# Patient Record
Sex: Male | Born: 1985
Health system: Southern US, Community
[De-identification: ages and names within clinical notes are randomized; demographics above are authoritative.]

## PROBLEM LIST (undated history)

## (undated) DIAGNOSIS — E669 Obesity, unspecified: Secondary | ICD-10-CM

## (undated) HISTORY — PX: TONSILLECTOMY: SUR1361

---

## 2013-09-01 ENCOUNTER — Encounter (HOSPITAL_BASED_OUTPATIENT_CLINIC_OR_DEPARTMENT_OTHER): Payer: Self-pay | Admitting: *Deleted

## 2013-09-01 ENCOUNTER — Emergency Department (HOSPITAL_BASED_OUTPATIENT_CLINIC_OR_DEPARTMENT_OTHER)
Admission: EM | Admit: 2013-09-01 | Discharge: 2013-09-01 | Disposition: A | Discharge status: Home / Self Care | Payer: Self-pay | Attending: Emergency Medicine | Admitting: Emergency Medicine

## 2013-09-01 ENCOUNTER — Emergency Department (HOSPITAL_BASED_OUTPATIENT_CLINIC_OR_DEPARTMENT_OTHER): Payer: Self-pay

## 2013-09-01 DIAGNOSIS — S46909A Unspecified injury of unspecified muscle, fascia and tendon at shoulder and upper arm level, unspecified arm, initial encounter: Secondary | ICD-10-CM | POA: Insufficient documentation

## 2013-09-01 DIAGNOSIS — Y929 Unspecified place or not applicable: Secondary | ICD-10-CM | POA: Insufficient documentation

## 2013-09-01 DIAGNOSIS — E669 Obesity, unspecified: Secondary | ICD-10-CM | POA: Insufficient documentation

## 2013-09-01 DIAGNOSIS — S4980XA Other specified injuries of shoulder and upper arm, unspecified arm, initial encounter: Secondary | ICD-10-CM | POA: Insufficient documentation

## 2013-09-01 DIAGNOSIS — Y9389 Activity, other specified: Secondary | ICD-10-CM | POA: Insufficient documentation

## 2013-09-01 DIAGNOSIS — X503XXA Overexertion from repetitive movements, initial encounter: Secondary | ICD-10-CM | POA: Insufficient documentation

## 2013-09-01 DIAGNOSIS — M7989 Other specified soft tissue disorders: Secondary | ICD-10-CM | POA: Insufficient documentation

## 2013-09-01 DIAGNOSIS — Y990 Civilian activity done for income or pay: Secondary | ICD-10-CM | POA: Insufficient documentation

## 2013-09-01 HISTORY — DX: Obesity, unspecified: E66.9

## 2013-09-01 MED ORDER — HYDROCODONE-ACETAMINOPHEN 5-325 MG PO TABS
2.0000 | ORAL_TABLET | Freq: Once | ORAL | Status: AC
  Administered 2013-09-01: 2 via ORAL
  Filled 2013-09-01: qty 2

## 2013-09-01 MED ORDER — IBUPROFEN 800 MG PO TABS: 800.0000 mg | ORAL_TABLET | Freq: Three times a day (TID) | ORAL | Status: DC

## 2013-09-01 NOTE — ED Notes (Signed)
Patient here with c/o swelling to his right hand above the wrist.  Patient lifts a lot at his work.  Swelling noted to his right arm just above the wrist.  Able to bend wrist but painful.

## 2013-09-01 NOTE — ED Provider Notes (Signed)
CSN: [id number]     Arrival date & time 09/01/13  2115 History   First MD Initiated Contact with Patient 09/01/13 2125     Chief Complaint  Patient presents with  . Arm Injury   (Consider location/radiation/quality/duration/timing/severity/associated sxs/prior Treatment) HPI Comments: No injury noted. Noted R forearm swelling that started earlier today. Associated pain. No fever, nausea, vomiting. No diarrhea.   Patient is a 27 y.o. male presenting with extremity pain. The history is provided by the patient.  Extremity Pain This is a new problem. The current episode started 6 to 12 hours ago. The problem occurs constantly. The problem has not changed since onset.Pertinent negatives include no chest pain, no abdominal pain, no headaches and no shortness of breath. Nothing aggravates the symptoms. Nothing relieves the symptoms. He has tried nothing for the symptoms.    Past Medical History  Diagnosis Date  . Obese    History reviewed. No pertinent past surgical history. History reviewed. No pertinent family history. History  Substance Use Topics  . Smoking status: Never Smoker   . Smokeless tobacco: Not on file  . Alcohol Use: No    Review of Systems  Respiratory: Negative for shortness of breath.   Cardiovascular: Negative for chest pain.  Gastrointestinal: Negative for abdominal pain.  Neurological: Negative for headaches.  All other systems reviewed and are negative.    Allergies  Review of patient's allergies indicates no known allergies.  Home Medications  No current outpatient prescriptions on file. BP 156/88  Pulse 85  Temp(Src) 98.1 F (36.7 C) (Oral)  Resp 20  Ht 5\' 7"  (1.702 m)  Wt 422 lb 6.4 oz (191.599 kg)  BMI 66.14 kg/m2  SpO2 100% Physical Exam  Nursing note and vitals reviewed. Constitutional: He is oriented to person, place, and time. He appears well-developed and well-nourished. No distress.  HENT:  Head: Normocephalic and atraumatic.   Mouth/Throat: No oropharyngeal exudate.  Eyes: EOM are normal. Pupils are equal, round, and reactive to light.  Neck: Normal range of motion. Neck supple.  Cardiovascular: Normal rate and regular rhythm.  Exam reveals no friction rub.   No murmur heard. Pulmonary/Chest: Effort normal and breath sounds normal. No respiratory distress. He has no wheezes. He has no rales.  Abdominal: He exhibits no distension. There is no tenderness. There is no rebound.  Musculoskeletal: Normal range of motion. He exhibits no edema.       Right forearm: He exhibits tenderness and swelling (distal forearm, no erythema, no celluluitis). He exhibits no bony tenderness, no edema, no deformity and no laceration.  Neurological: He is alert and oriented to person, place, and time.  Skin: He is not diaphoretic.    ED Course  Procedures (including critical care time) Labs Review Labs Reviewed - No data to display Imaging Review Dg Forearm Right  09/01/2013   CLINICAL DATA:  Right forearm pain.  EXAM: RIGHT FOREARM - 2 VIEW  COMPARISON:  No priors.  FINDINGS: AP and lateral views of the right forearm demonstrate no definite acute displaced fracture. Overlying soft tissues are unremarkable.  IMPRESSION: No evidence of significant acute traumatic injury to the right radius or ulna.   Electronically Signed   By: Trudie Reed M.D.   On: 09/01/2013 22:23    MDM   1. Arm swelling    Presents with right forearm swelling. No injury noted. 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 He stated the right distal forearm insulin today. On exam,  vitals are stable. His normal right elbow function with normal range of motion. He has decreased range of motion his right wrist, but no wrist effusion, no redness of the skin, no cellulitis. He has formed swelling on the dorsal aspect of the forearm and the distal aspect spreading to about midshaft of the forearm. It is mildly tender. It is not warm, is not  fluctuant is not cellulitic. Xray ordered and negative. Patient instructed to rest, ice, compress. No need for antibiotics at this time.   Dagmar Hait, MD 09/01/13 [phone]

## 2013-09-12 DIAGNOSIS — J209 Acute bronchitis, unspecified: Secondary | ICD-10-CM | POA: Insufficient documentation

## 2013-09-12 DIAGNOSIS — R0789 Other chest pain: Secondary | ICD-10-CM | POA: Insufficient documentation

## 2013-09-12 DIAGNOSIS — Z791 Long term (current) use of non-steroidal anti-inflammatories (NSAID): Secondary | ICD-10-CM | POA: Insufficient documentation

## 2013-09-12 NOTE — ED Notes (Signed)
Pt c/o chest congestion, cough and chest tightness x 3 days.

## 2013-09-13 ENCOUNTER — Encounter (HOSPITAL_BASED_OUTPATIENT_CLINIC_OR_DEPARTMENT_OTHER): Payer: Self-pay | Admitting: Emergency Medicine

## 2013-09-13 ENCOUNTER — Emergency Department (HOSPITAL_BASED_OUTPATIENT_CLINIC_OR_DEPARTMENT_OTHER): Payer: Self-pay

## 2013-09-13 ENCOUNTER — Emergency Department (HOSPITAL_BASED_OUTPATIENT_CLINIC_OR_DEPARTMENT_OTHER)
Admission: EM | Admit: 2013-09-13 | Discharge: 2013-09-13 | Disposition: A | Discharge status: Home / Self Care | Payer: Self-pay | Attending: Emergency Medicine | Admitting: Emergency Medicine

## 2013-09-13 DIAGNOSIS — J40 Bronchitis, not specified as acute or chronic: Secondary | ICD-10-CM

## 2013-09-13 HISTORY — DX: Morbid (severe) obesity due to excess calories: E66.01

## 2013-09-13 MED ORDER — DOXYCYCLINE HYCLATE 100 MG PO CAPS: 100.0000 mg | ORAL_CAPSULE | Freq: Two times a day (BID) | ORAL | Status: DC

## 2013-09-13 MED ORDER — PREDNISONE 50 MG PO TABS: 50.0000 mg | ORAL_TABLET | Freq: Every day | ORAL | Status: DC

## 2013-09-13 NOTE — ED Provider Notes (Signed)
CSN: [id number]     Arrival date & time 09/12/13  2349 History   First MD Initiated Contact with Patient 09/13/13 [phone]     Chief Complaint  Patient presents with  . URI   (Consider location/radiation/quality/duration/timing/severity/associated sxs/prior Treatment) HPI Comments: 27 y/o comes in with cc of chest congestion and tightness x 3 days. Pt has hx of DM, HTN, no cardiac hx, no hx of DVT, PE. Pt reports that for the past few days, he has been having cough and URI like sx. Cough is mostly dry. He has some chest tightness, only with cough. He is noted to morbidly obese, but denies any dib, recent travels, cancer hx, exogenous estrogen use. Denies drug use, smoking. No hx of asthma.   Patient is a 27 y.o. male presenting with URI. The history is provided by the patient.  URI Presenting symptoms: congestion and cough     Past Medical History  Diagnosis Date  . Obese   . Morbid obesity    Past Surgical History  Procedure Laterality Date  . Tonsillectomy     No family history on file. History  Substance Use Topics  . Smoking status: Never Smoker   . Smokeless tobacco: Not on file  . Alcohol Use: No    Review of Systems  Constitutional: Negative for diaphoresis, activity change and appetite change.  HENT: Positive for congestion.   Respiratory: Positive for cough and chest tightness. Negative for shortness of breath.   Cardiovascular: Positive for chest pain.  Gastrointestinal: Negative for nausea, vomiting and abdominal pain.    Allergies  Review of patient's allergies indicates no known allergies.  Home Medications   Current Outpatient Rx  Name  Route  Sig  Dispense  Refill  . Pseudoeph-Doxylamine-DM-APAP (NIGHT TIME LIQUID CAPS PO)   Oral   Take by mouth.         . doxycycline (VIBRAMYCIN) 100 MG capsule   Oral   Take 1 capsule (100 mg total) by mouth 2 (two) times daily.   14 capsule   0   . ibuprofen (ADVIL,MOTRIN) 800 MG tablet   Oral   Take 1  tablet (800 mg total) by mouth 3 (three) times daily.   21 tablet   0   . predniSONE (DELTASONE) 50 MG tablet   Oral   Take 1 tablet (50 mg total) by mouth daily.   5 tablet   0    BP 183/107  Pulse 82  Temp(Src) 98.7 F (37.1 C) (Oral)  Resp 18  Ht 5\' 7"  (1.702 m)  Wt 417 lb 12.8 oz (189.513 kg)  BMI 65.42 kg/m2  SpO2 100% Physical Exam  Nursing note and vitals reviewed. Constitutional: He is oriented to person, place, and time. He appears well-developed.  Morbidly obese  HENT:  Head: Normocephalic and atraumatic.  Eyes: Conjunctivae and EOM are normal. Pupils are equal, round, and reactive to light.  Neck: Normal range of motion. Neck supple.  Cardiovascular: Normal rate and regular rhythm.   Pulmonary/Chest: Effort normal. No respiratory distress. He has wheezes. He has no rales.  Very faint wheez in the upper lung fields.  Abdominal: Soft. Bowel sounds are normal. He exhibits no distension. There is no tenderness. There is no rebound and no guarding.  Neurological: He is alert and oriented to person, place, and time.  Skin: Skin is warm.    ED Course  Procedures (including critical care time) Labs Review Labs Reviewed - No data to display Imaging Review Dg Chest  2 View  09/13/2013   CLINICAL DATA:  Bilateral upper chest pain. Cough and shortness of breath. Symptoms for 4 days.  EXAM: CHEST  2 VIEW  COMPARISON:  None.  FINDINGS: Heart size and mediastinal contours are within normal limits. Both lungs are clear. Visualized skeletal structures are unremarkable.  IMPRESSION: Negative chest.   Electronically Signed   By: Drusilla Kanner M.D.   On: 09/13/2013 01:31    MDM   1. Bronchitis    Pt comes in with cc of chest tightness, with cough and exam indicates very fine wheez. He is only 26, does have DM, HTN hx, but these sx are all present for 3 days, and the constellation of sx make it unlikely to be cardiac. He is morbidly obese, but has no other risk factors for  PE, and his legs shows no unilateral calf tenderness or leg swelling. Pt is also PERC negative.  We will treat him as bronchitis, CXR is normal, and i discussed the warning signs for PE and, he is to return to the ER if the symptoms get worse.   Derwood Kaplan, MD 09/13/13 [phone]

## 2014-08-24 IMAGING — CR DG CHEST 2V
2 series · 2 of 2 positions shown · non-contrast
Comparison: None.

CLINICAL DATA: Bilateral upper chest pain. Cough and shortness of
breath. Symptoms for 4 days.

EXAM:
CHEST  2 VIEW

[w chest pa]
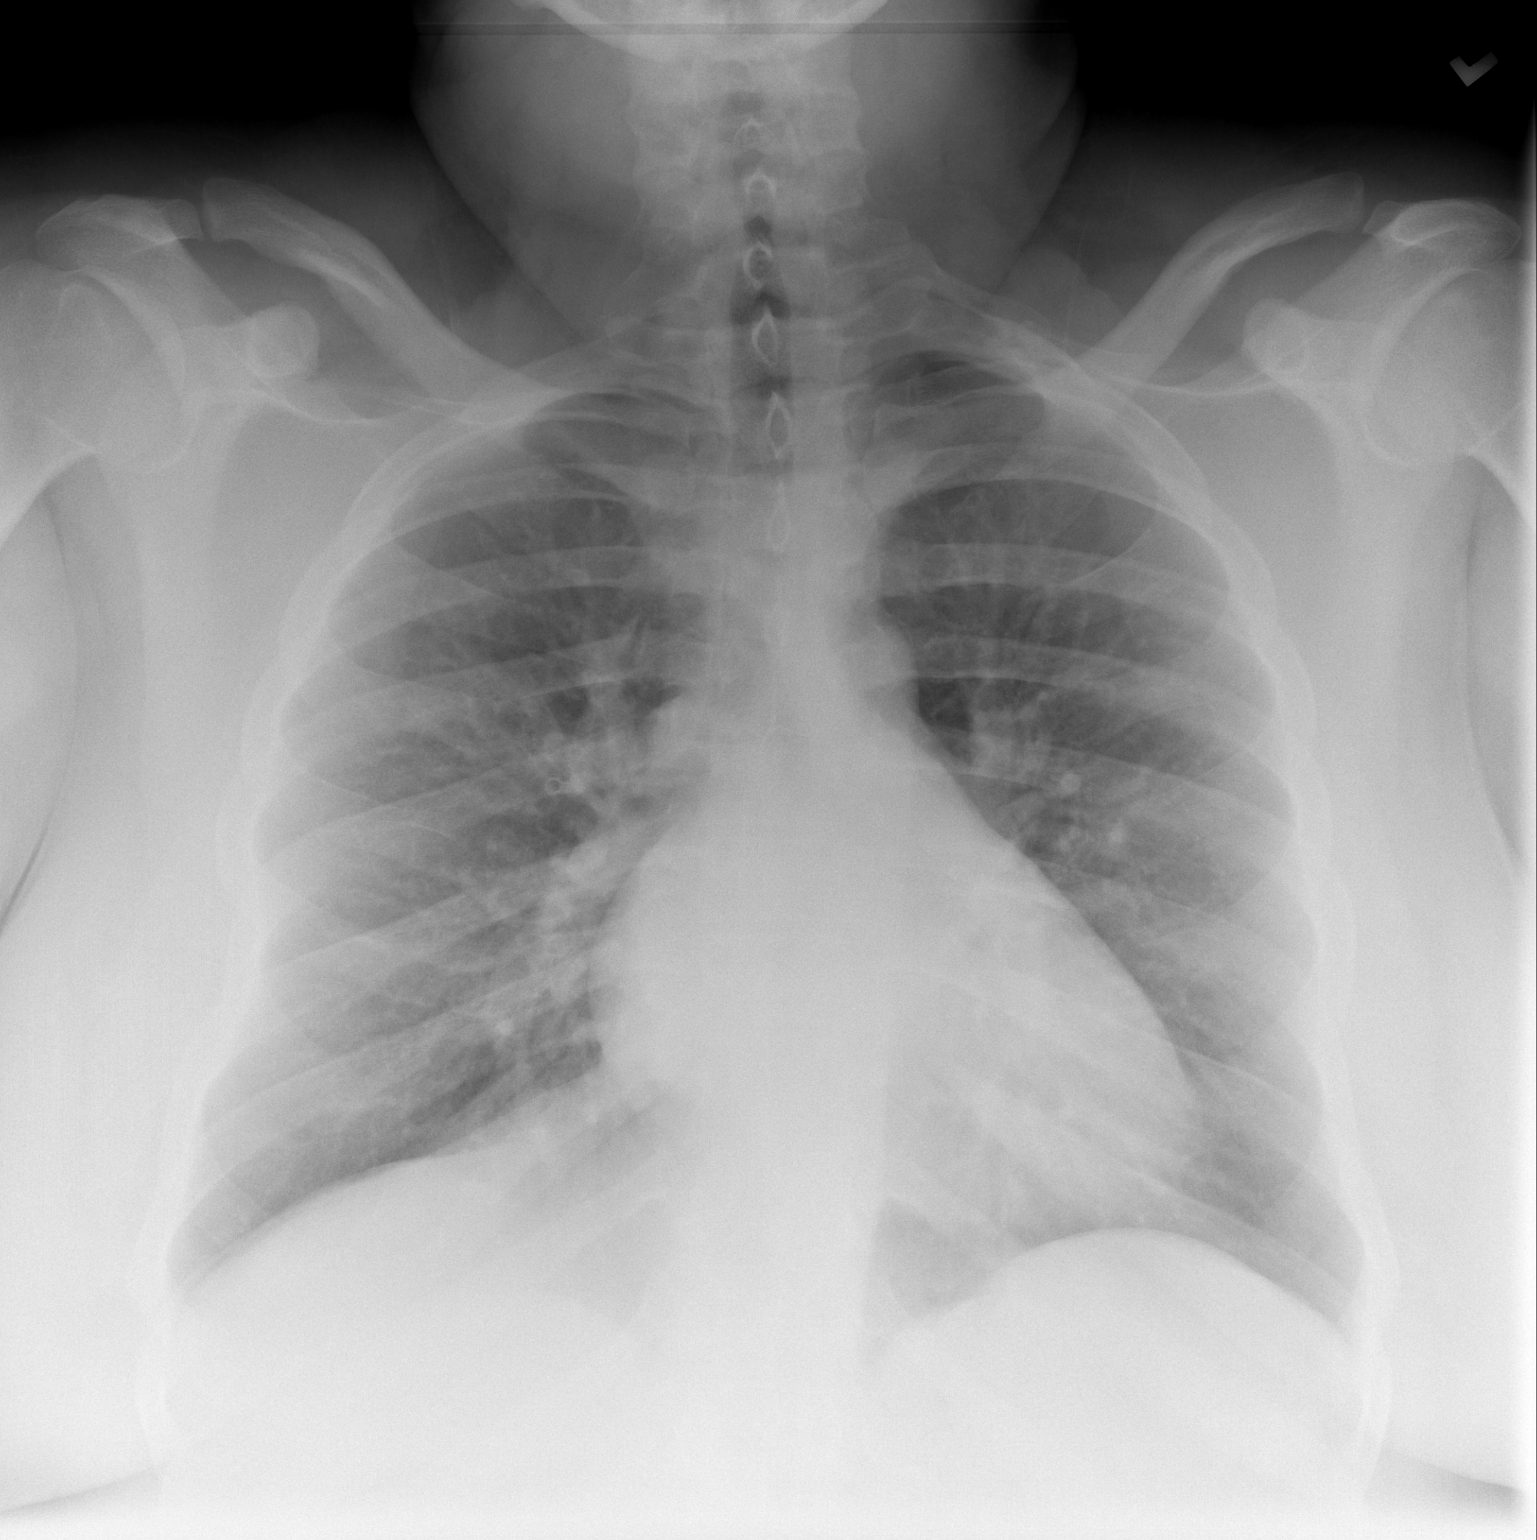

[w chest lat]
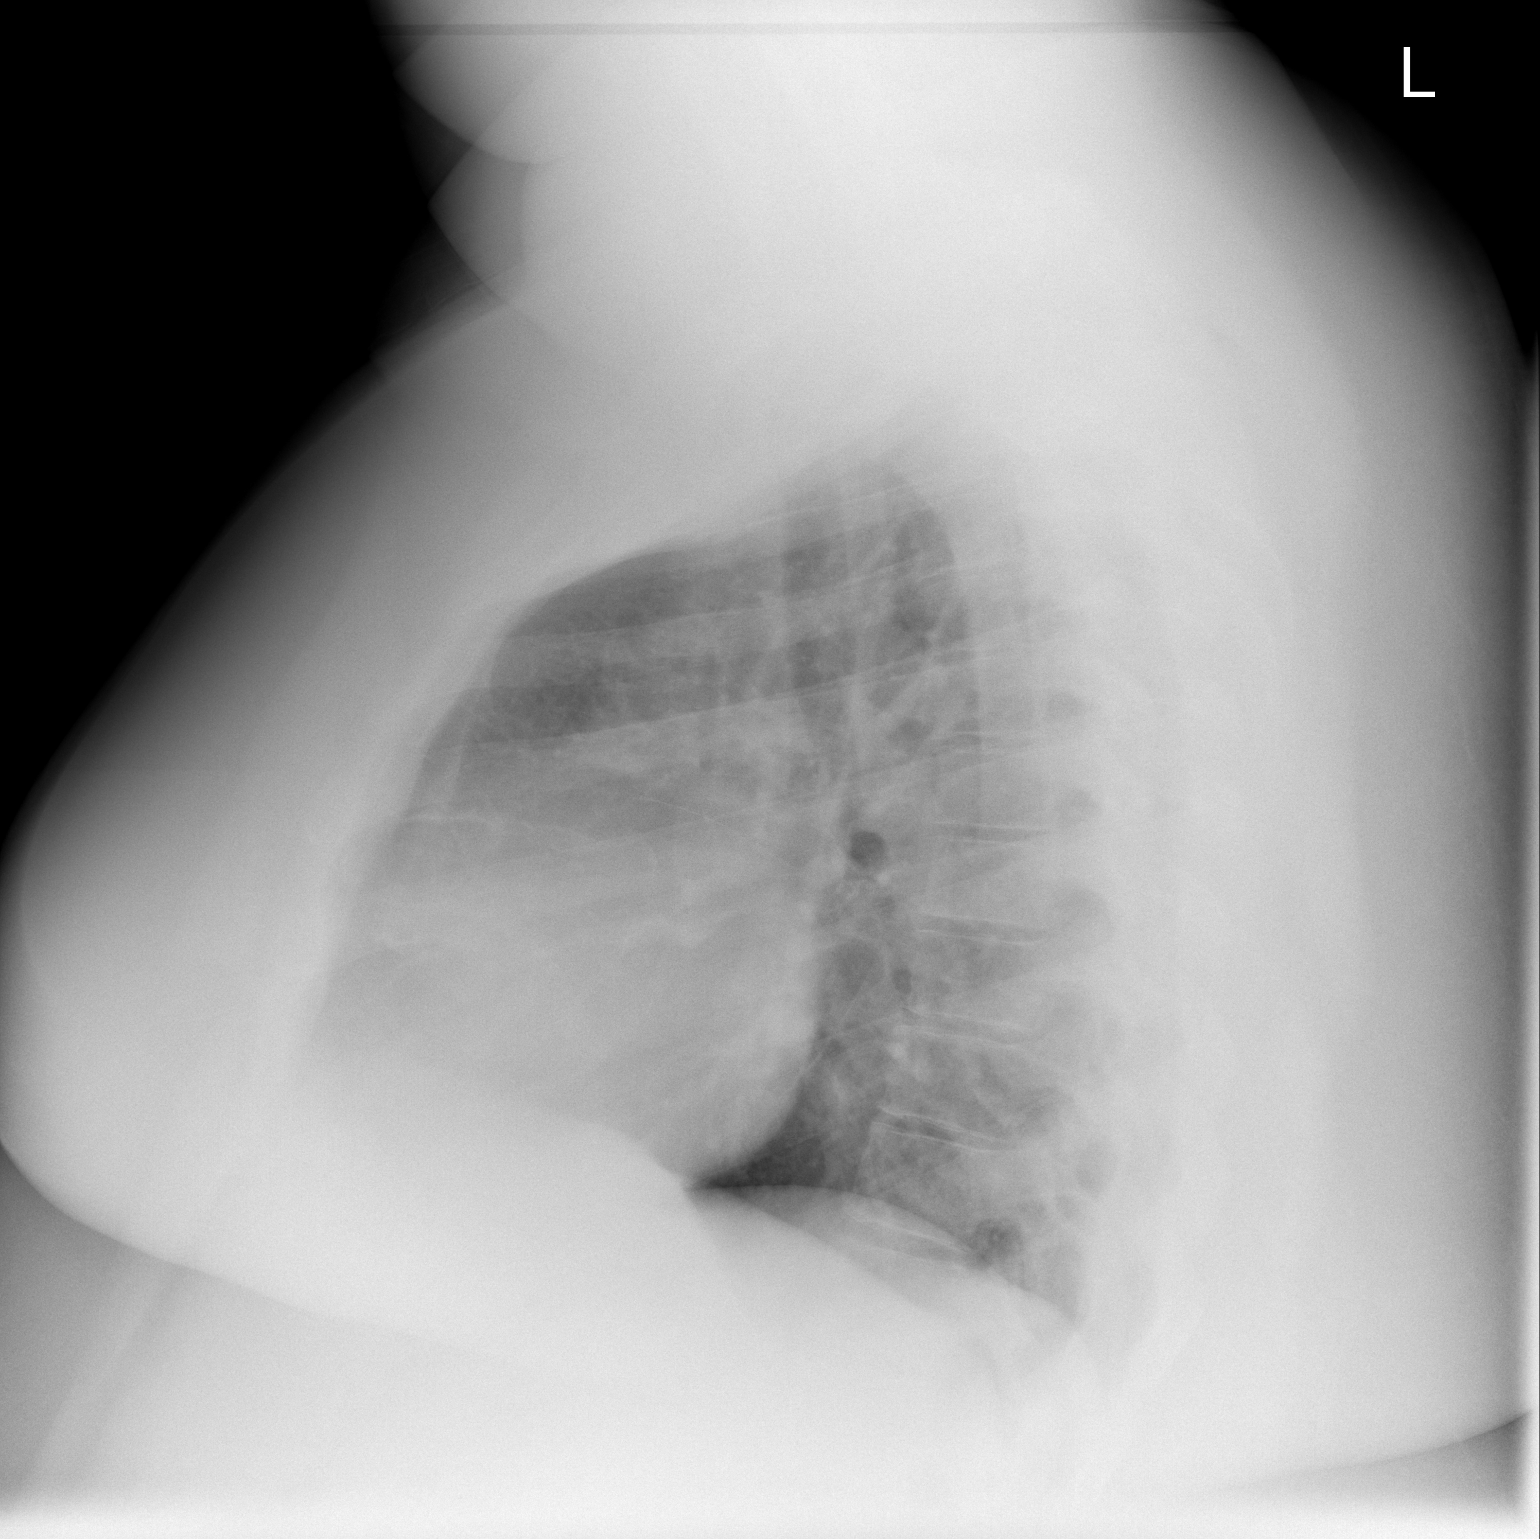

[2 of 2 positions shown; findings below may reference images not displayed]

FINDINGS: Heart size and mediastinal contours are within normal limits. Both
lungs are clear. Visualized skeletal structures are unremarkable.
IMPRESSION: Negative chest.

## 2016-05-06 ENCOUNTER — Emergency Department (HOSPITAL_BASED_OUTPATIENT_CLINIC_OR_DEPARTMENT_OTHER)
Admission: EM | Admit: 2016-05-06 | Discharge: 2016-05-06 | Disposition: A | Discharge status: Home / Self Care | Payer: Self-pay | Attending: Dermatology | Admitting: Dermatology

## 2016-05-06 ENCOUNTER — Encounter (HOSPITAL_BASED_OUTPATIENT_CLINIC_OR_DEPARTMENT_OTHER): Payer: Self-pay | Admitting: *Deleted

## 2016-05-06 DIAGNOSIS — F17210 Nicotine dependence, cigarettes, uncomplicated: Secondary | ICD-10-CM | POA: Insufficient documentation

## 2016-05-06 DIAGNOSIS — R109 Unspecified abdominal pain: Secondary | ICD-10-CM | POA: Insufficient documentation

## 2016-05-06 DIAGNOSIS — Z5321 Procedure and treatment not carried out due to patient leaving prior to being seen by health care provider: Secondary | ICD-10-CM | POA: Insufficient documentation

## 2016-05-06 NOTE — ED Notes (Signed)
Called to bring pt to room and no answer.

## 2016-05-06 NOTE — ED Notes (Signed)
Abdominal pain since last night. Feels like a pulled muscle that hurts when he coughs or sneezes.

## 2016-05-06 NOTE — ED Notes (Signed)
No answer

## 2018-11-26 ENCOUNTER — Encounter (HOSPITAL_BASED_OUTPATIENT_CLINIC_OR_DEPARTMENT_OTHER): Payer: Self-pay | Admitting: Emergency Medicine

## 2018-11-26 ENCOUNTER — Emergency Department (HOSPITAL_BASED_OUTPATIENT_CLINIC_OR_DEPARTMENT_OTHER): Payer: Self-pay

## 2018-11-26 ENCOUNTER — Emergency Department (HOSPITAL_BASED_OUTPATIENT_CLINIC_OR_DEPARTMENT_OTHER)
Admission: EM | Admit: 2018-11-26 | Discharge: 2018-11-26 | Disposition: A | Discharge status: Home / Self Care | Payer: Self-pay | Attending: Emergency Medicine | Admitting: Emergency Medicine

## 2018-11-26 ENCOUNTER — Other Ambulatory Visit: Payer: Self-pay

## 2018-11-26 DIAGNOSIS — R05 Cough: Secondary | ICD-10-CM

## 2018-11-26 DIAGNOSIS — B9789 Other viral agents as the cause of diseases classified elsewhere: Secondary | ICD-10-CM | POA: Insufficient documentation

## 2018-11-26 DIAGNOSIS — R059 Cough, unspecified: Secondary | ICD-10-CM

## 2018-11-26 DIAGNOSIS — F17210 Nicotine dependence, cigarettes, uncomplicated: Secondary | ICD-10-CM | POA: Insufficient documentation

## 2018-11-26 DIAGNOSIS — J069 Acute upper respiratory infection, unspecified: Secondary | ICD-10-CM | POA: Insufficient documentation

## 2018-11-26 DIAGNOSIS — Z79899 Other long term (current) drug therapy: Secondary | ICD-10-CM | POA: Insufficient documentation

## 2018-11-26 MED ORDER — ONDANSETRON 4 MG PO TBDP
ORAL_TABLET | ORAL | Status: AC
  Administered 2018-11-26: 4 mg
  Filled 2018-11-26: qty 1

## 2018-11-26 MED ORDER — ONDANSETRON HCL 4 MG PO TABS
4.0000 mg | ORAL_TABLET | Freq: Three times a day (TID) | ORAL | 0 refills | Status: AC | PRN
Start: 2018-11-26 — End: ?

## 2018-11-26 MED ORDER — ONDANSETRON HCL 8 MG PO TABS
4.0000 mg | ORAL_TABLET | Freq: Once | ORAL | Status: DC
  Filled 2018-11-26: qty 1

## 2018-11-26 MED ORDER — BENZONATATE 100 MG PO CAPS
100.0000 mg | ORAL_CAPSULE | Freq: Three times a day (TID) | ORAL | 0 refills | Status: AC
Start: 2018-11-26 — End: ?

## 2018-11-26 MED FILL — BENZONATATE 100 MG CAP: 100 | 7 days supply | Qty: 21 | Fill #0

## 2018-11-26 MED FILL — ONDANSETRON HCL 4 MG TABLET: 4 | 4 days supply | Qty: 12 | Fill #0

## 2018-11-26 NOTE — ED Triage Notes (Addendum)
Reports productive cough and sore throat x 2 days.

## 2018-11-26 NOTE — ED Provider Notes (Signed)
[facility] EMERGENCY DEPARTMENT Provider Note   CSN: [id number] Arrival date & time: 11/26/18  1047     History   Chief Complaint Chief Complaint  Patient presents with  . Cough    HPI Johnny Brewer is a 32 y.o. male.  The history is provided by the patient and medical records. No language interpreter was used.  URI   This is a new problem. The current episode started more than 2 days ago. The problem has not changed since onset.Maximum temperature: subjective. Associated symptoms include chest pain, diarrhea, nausea, congestion and cough. Pertinent negatives include no abdominal pain, no vomiting, no dysuria, no headaches, no sore throat, no neck pain, no rash and no wheezing. He has tried nothing for the symptoms. The treatment provided no relief.    Past Medical History:  Diagnosis Date  . Morbid obesity (HCC)   . Obese     There are no active problems to display for this patient.   Past Surgical History:  Procedure Laterality Date  . TONSILLECTOMY          Home Medications    Prior to Admission medications   Medication Sig Start Date End Date Taking? Authorizing Provider  doxycycline (VIBRAMYCIN) 100 MG capsule Take 1 capsule (100 mg total) by mouth 2 (two) times daily. 09/13/13   Derwood Kaplan, MD  ibuprofen (ADVIL,MOTRIN) 800 MG tablet Take 1 tablet (800 mg total) by mouth 3 (three) times daily. 09/01/13   Elwin Mocha, MD  predniSONE (DELTASONE) 50 MG tablet Take 1 tablet (50 mg total) by mouth daily. 09/13/13   Derwood Kaplan, MD  Pseudoeph-Doxylamine-DM-APAP (NIGHT TIME LIQUID CAPS PO) Take by mouth.    [provider]    Family History History reviewed. No pertinent family history.  Social History Social History   Tobacco Use  . Smoking status: Current Every Day Smoker    Types: Cigarettes  Substance Use Topics  . Alcohol use: No  . Drug use: Yes    Types: Marijuana     Allergies   Patient has no known  allergies.   Review of Systems Review of Systems  Constitutional: Positive for chills, fatigue and fever. Negative for diaphoresis.  HENT: Positive for congestion. Negative for sore throat.   Eyes: Negative for visual disturbance.  Respiratory: Positive for cough, chest tightness and shortness of breath. Negative for wheezing.   Cardiovascular: Positive for chest pain.  Gastrointestinal: Positive for diarrhea and nausea. Negative for abdominal pain, constipation and vomiting.  Genitourinary: Negative for dysuria, flank pain and frequency.  Musculoskeletal: Negative for back pain and neck pain.  Skin: Negative for rash and wound.  Neurological: Negative for light-headedness, numbness and headaches.  Psychiatric/Behavioral: Negative for agitation.  All other systems reviewed and are negative.    Physical Exam Updated Vital Signs BP (!) 141/80   Pulse 99   Temp 98.7 F (37.1 C) (Oral)   Resp 20   Ht 5\' 8"  (1.727 m)   Wt (!) 181.4 kg   SpO2 100%   BMI 60.82 kg/m   Physical Exam  Constitutional: He is oriented to person, place, and time. He appears well-developed and well-nourished. No distress.  HENT:  Head: Normocephalic and atraumatic.  Mouth/Throat: Oropharynx is clear and moist. No oropharyngeal exudate.  Eyes: Pupils are equal, round, and reactive to light. Conjunctivae and EOM are normal.  Neck: Normal range of motion. Neck supple.  Cardiovascular: Normal rate and regular rhythm.  No murmur heard. Pulmonary/Chest: Effort normal and breath  sounds normal. No respiratory distress. He has no wheezes. He has no rales. He exhibits tenderness.    Abdominal: Soft. There is no tenderness.  Musculoskeletal: He exhibits no edema or tenderness.  Neurological: He is alert and oriented to person, place, and time. No sensory deficit. He exhibits normal muscle tone.  Skin: Skin is warm and dry. He is not diaphoretic. No erythema. No pallor.  Psychiatric: He has a normal mood and  affect.  Nursing note and vitals reviewed.    ED Treatments / Results  Labs (all labs ordered are listed, but only abnormal results are displayed) Labs Reviewed - No data to display  EKG None  Radiology Dg Chest 2 View  Result Date: 11/26/2018 CLINICAL DATA:  Cough and congestion EXAM: CHEST - 2 VIEW COMPARISON:  September 13, 2013 FINDINGS: There is no edema or consolidation. Heart size and pulmonary vascularity are normal. No adenopathy. No bone lesions. IMPRESSION: No edema or consolidation. Electronically Signed   By: Bretta Bang III M.D.   On: 11/26/2018 11:20    Procedures Procedures (including critical care time)  Medications Ordered in ED Medications  ondansetron (ZOFRAN) tablet 4 mg (has no administration in time range)  ondansetron (ZOFRAN-ODT) 4 MG disintegrating tablet (4 mg  Given 11/26/18 1147)     Initial Impression / Assessment and Plan / ED Course  I have reviewed the triage vital signs and the nursing notes.  Pertinent labs & imaging results that were available during my care of the patient were reviewed by me and considered in my medical decision making (see chart for details).     Johnny Brewer is a 32 y.o. male with no significant past medical history who presents with a two-week history of intermittent congestion, rhinorrhea, cough, and shortness of breath.  Patient does report sick contacts with family members.  He says that he has had 2 weeks of on and off URI symptoms but over the last several days it is worsened.  He reports that he has had a production with his cough with phlegm.  He reports chest tightness and chest discomfort when he coughs.  He denies exertional chest pain.  He reports some nausea but no vomiting.  He reports he had decreased oral intake.  He denies any constipation or urinary symptoms but does report some mild diarrhea.  He thinks he feel slightly dehydrated with a decreased oral intake.  He denies any recent traumatic injuries.   Denies any palpitations and denies any syncopal episodes.  On exam, patient has clear lungs.  Chest is slightly to palpation reproducing the tightness and discomfort.  No murmur.  Abdomen is nontender.  Legs are nonedematous.  Patient resting comfortably on room air.  Patient has visible rhinorrhea and audible congestion.  Given patient's productive cough, will obtain x-ray to look for development of pneumonia in the setting of URI.  X-ray shows no pneumonia.  Patient given nausea medication and p.o. challenge to prove he can maintain hydration.  At this time, do not feel he needs other lab work.  Patient may have a flulike or viral illness however as he has had symptoms for more than 36 hours, do not feel flu testing would be helpful.  Patient agrees with plan of care and will be reassessed.  Anticipate discharge home for outpatient management of URI with cough.  1:04 PM Patient was feeling better after the nausea medicine was able to tolerate p.o.  He can stay hydrated at home.  X-ray shows no  evidence of pneumonia.  Suspect viral URI causing symptoms.   Patient will give prescription for Tessalon for his cough and Zofran for nausea and help him stay hydrated.  He will follow with PCP.  He understood strict return precautions and was discharged in good condition with improved symptoms.  Final Clinical Impressions(s) / ED Diagnoses   Final diagnoses:  Cough  Viral URI with cough    ED Discharge Orders         Ordered    ondansetron (ZOFRAN) 4 MG tablet  Every 8 hours PRN     11/26/18 1305    benzonatate (TESSALON) 100 MG capsule  Every 8 hours     11/26/18 1305         Clinical Impression: 1. Cough   2. Viral URI with cough     Disposition: Discharge  Condition: Good  I have discussed the results, Dx and Tx plan with the pt(& family if present). He/she/they expressed understanding and agree(s) with the plan. Discharge instructions discussed at great length. Strict return  precautions discussed and pt &/or family have verbalized understanding of the instructions. No further questions at time of discharge.    New Prescriptions   BENZONATATE (TESSALON) 100 MG CAPSULE    Take 1 capsule (100 mg total) by mouth every 8 (eight) hours.   ONDANSETRON (ZOFRAN) 4 MG TABLET    Take 1 tablet (4 mg total) by mouth every 8 (eight) hours as needed for nausea or vomiting.    [facility]: [facility] [address] [place] [id number] [phone] Schedule an appointment as soon as possible for a visit    [facility] [address] [id number] HQ [id number] [address] [phone]       Tegeler, Canary Brimhristopher J, MD 11/26/18 1308

## 2018-11-26 NOTE — Discharge Instructions (Signed)
Your x-ray today did not show evidence of pneumonia.  I suspect you have a viral upper infection leading to your cough congestion and other symptoms.  Please use the cough medicine, Tessalon, to help with your cough.  Please stay hydrated.  Please use the nausea medicine to help if you develop nausea.  Please help with your primary doctor for further management.  If any symptoms change or worsen, please return to the nearest emergency department.

## 2020-04-14 ENCOUNTER — Emergency Department (HOSPITAL_BASED_OUTPATIENT_CLINIC_OR_DEPARTMENT_OTHER)
Admission: EM | Admit: 2020-04-14 | Discharge: 2020-04-14 | Disposition: A | Discharge status: Home / Self Care | Payer: Self-pay | Attending: Emergency Medicine | Admitting: Emergency Medicine

## 2020-04-14 ENCOUNTER — Encounter (HOSPITAL_BASED_OUTPATIENT_CLINIC_OR_DEPARTMENT_OTHER): Payer: Self-pay

## 2020-04-14 ENCOUNTER — Other Ambulatory Visit: Payer: Self-pay

## 2020-04-14 DIAGNOSIS — R1032 Left lower quadrant pain: Secondary | ICD-10-CM | POA: Insufficient documentation

## 2020-04-14 DIAGNOSIS — Z5321 Procedure and treatment not carried out due to patient leaving prior to being seen by health care provider: Secondary | ICD-10-CM | POA: Insufficient documentation

## 2020-04-14 NOTE — ED Triage Notes (Addendum)
Pt states he "felt a pop" to left abd area with cough/sneezing this am-pain worse with movement-c/o cough/sneezing x 1 month-NAD-steady gait

## 2020-10-03 ENCOUNTER — Encounter (HOSPITAL_BASED_OUTPATIENT_CLINIC_OR_DEPARTMENT_OTHER): Payer: Self-pay | Admitting: Emergency Medicine

## 2020-10-03 ENCOUNTER — Emergency Department (HOSPITAL_BASED_OUTPATIENT_CLINIC_OR_DEPARTMENT_OTHER): Payer: Self-pay

## 2020-10-03 ENCOUNTER — Other Ambulatory Visit: Payer: Self-pay

## 2020-10-03 ENCOUNTER — Emergency Department (HOSPITAL_BASED_OUTPATIENT_CLINIC_OR_DEPARTMENT_OTHER)
Admission: EM | Admit: 2020-10-03 | Discharge: 2020-10-03 | Disposition: A | Discharge status: Home / Self Care | Payer: Self-pay | Attending: Emergency Medicine | Admitting: Emergency Medicine

## 2020-10-03 DIAGNOSIS — X501XXA Overexertion from prolonged static or awkward postures, initial encounter: Secondary | ICD-10-CM | POA: Insufficient documentation

## 2020-10-03 DIAGNOSIS — M25521 Pain in right elbow: Secondary | ICD-10-CM | POA: Insufficient documentation

## 2020-10-03 DIAGNOSIS — Z87891 Personal history of nicotine dependence: Secondary | ICD-10-CM | POA: Insufficient documentation

## 2020-10-03 MED ORDER — IBUPROFEN 800 MG PO TABS
800.0000 mg | ORAL_TABLET | Freq: Once | ORAL | Status: AC
  Administered 2020-10-03: 800 mg via ORAL
  Filled 2020-10-03: qty 1

## 2020-10-03 NOTE — ED Triage Notes (Addendum)
Pt was stretching on Thursday and felt pop in his right elbow and has been in 10/10 pain since even at rest.

## 2020-10-03 NOTE — Discharge Instructions (Signed)
Use sling for comfort.  Recommend 600 mg of Motrin every 8 hours for the next 5 days and then as needed.  Recommend 650 mg of Tylenol as needed for pain as well.  Follow-up with sports medicine.  Do some gentle stretching but no heavy lifting with the right upper extremity.

## 2020-10-03 NOTE — ED Provider Notes (Signed)
[facility] EMERGENCY DEPARTMENT Provider Note   CSN: [id number] Arrival date & time: 10/03/20  0840     History Chief Complaint  Patient presents with  . Elbow Injury    Johnny Brewer is a 34 y.o. male.  Patient felt a pop in his right upper arm after raising his arms up several days ago.  Has had pain since.  Difficulty range of motion at the elbow.  Pain mostly in the right bicep area.  The history is provided by the patient.  Arm Injury Location:  Elbow Elbow location:  R elbow Pain details:    Quality:  Aching   Radiates to:  Does not radiate   Severity:  Mild   Onset quality:  Gradual   Duration:  3 days   Timing:  Intermittent   Progression:  Waxing and waning Handedness:  Right-handed Relieved by:  Nothing Worsened by:  Nothing Associated symptoms: decreased range of motion and stiffness   Associated symptoms: no back pain, no fatigue, no fever and no swelling        Past Medical History:  Diagnosis Date  . Morbid obesity (HCC)   . Obese     There are no problems to display for this patient.   Past Surgical History:  Procedure Laterality Date  . TONSILLECTOMY         History reviewed. No pertinent family history.  Social History   Tobacco Use  . Smoking status: Former Games developer  . Smokeless tobacco: Never Used  Vaping Use  . Vaping Use: Never used  Substance Use Topics  . Alcohol use: No  . Drug use: Yes    Types: Marijuana    Home Medications Prior to Admission medications   Medication Sig Start Date End Date Taking? Authorizing Provider  benzonatate (TESSALON) 100 MG capsule Take 1 capsule (100 mg total) by mouth every 8 (eight) hours. 11/26/18   Tegeler, Canary Brim, MD  doxycycline (VIBRAMYCIN) 100 MG capsule Take 1 capsule (100 mg total) by mouth 2 (two) times daily. 09/13/13   Derwood Kaplan, MD  ibuprofen (ADVIL,MOTRIN) 800 MG tablet Take 1 tablet (800 mg total) by mouth 3 (three) times daily. 09/01/13   Elwin Mocha,  MD  ondansetron (ZOFRAN) 4 MG tablet Take 1 tablet (4 mg total) by mouth every 8 (eight) hours as needed for nausea or vomiting. 11/26/18   Tegeler, Canary Brim, MD  predniSONE (DELTASONE) 50 MG tablet Take 1 tablet (50 mg total) by mouth daily. 09/13/13   Derwood Kaplan, MD  Pseudoeph-Doxylamine-DM-APAP (NIGHT TIME LIQUID CAPS PO) Take by mouth.    [provider]    Allergies    Patient has no known allergies.  Review of Systems   Review of Systems  Constitutional: Negative for chills, fatigue and fever.  HENT: Negative for ear pain and sore throat.   Eyes: Negative for pain and visual disturbance.  Respiratory: Negative for cough and shortness of breath.   Cardiovascular: Negative for chest pain and palpitations.  Gastrointestinal: Negative for abdominal pain and vomiting.  Genitourinary: Negative for dysuria and hematuria.  Musculoskeletal: Positive for stiffness. Negative for arthralgias and back pain.  Skin: Negative for color change, pallor, rash and wound.  Neurological: Negative for seizures, syncope, weakness and numbness.  All other systems reviewed and are negative.   Physical Exam Updated Vital Signs BP 140/72   Pulse 90   Temp 98 F (36.7 C)   Resp 18   SpO2 100%   Physical Exam Vitals  and nursing note reviewed.  Constitutional:      Appearance: He is well-developed.  HENT:     Head: Normocephalic and atraumatic.  Eyes:     Conjunctiva/sclera: Conjunctivae normal.  Cardiovascular:     Rate and Rhythm: Normal rate and regular rhythm.     Pulses: Normal pulses.     Heart sounds: No murmur heard.   Pulmonary:     Effort: Pulmonary effort is normal. No respiratory distress.     Breath sounds: Normal breath sounds.  Abdominal:     Palpations: Abdomen is soft.     Tenderness: There is no abdominal tenderness.  Musculoskeletal:        General: Tenderness present.     Cervical back: Neck supple.     Comments: Decreased range of motion with  flexion and extension at the right elbow, pain with flexion extension at the elbow and with supination, tenderness in the right bicep area, there is no obvious swelling around the right elbow  Skin:    General: Skin is warm and dry.  Neurological:     General: No focal deficit present.     Mental Status: He is alert.     Sensory: No sensory deficit.     Motor: No weakness.     Comments: Strength and sensation to the right upper extremity appear intact however limited due to pain     ED Results / Procedures / Treatments   Labs (all labs ordered are listed, but only abnormal results are displayed) Labs Reviewed - No data to display  EKG None  Radiology DG Elbow Complete Right  Result Date: 10/03/2020 CLINICAL DATA:  Stretching injury affecting the right elbow now with inability to fully extend the elbow with pain primarily medially located. EXAM: RIGHT ELBOW - COMPLETE 3+ VIEW COMPARISON:  None. FINDINGS: Examination is degraded due to patient body habitus. No definite fracture or elbow joint effusion. There is a peripherally calcified punctate ossicle adjacent to the medial aspect of the proximal ulna as well as enthesopathic change involving the medial epicondyle, both of which likely represent the sequela of remote avulsive injury. Joint spaces appear preserved. There is potential retraction of the biceps musculature as could be seen in the setting of age-indeterminate biceps injury/tear. Regional soft tissues appear otherwise normal. IMPRESSION: 1. No definite acute fracture or elbow joint effusion. 2. Potential retraction of the biceps musculature as could be seen in the setting of age-indeterminate biceps injury/tear. Further evaluation could be performed with MRI as indicated. Electronically Signed   By: Simonne Come M.D.   On: 10/03/2020 09:13    Procedures Procedures (including critical care time)  Medications Ordered in ED Medications  ibuprofen (ADVIL) tablet 800 mg (800 mg  Oral Given 10/03/20 0920)    ED Course  I have reviewed the triage vital signs and the nursing notes.  Pertinent labs & imaging results that were available during my care of the patient were reviewed by me and considered in my medical decision making (see chart for details).    MDM Rules/Calculators/A&P                          Bralyn Folkert is a 34 year old male who presents to the ED with right elbow pain.  Patient with unremarkable vitals, no fever.  Patient felt sudden pop in his right elbow about 3 days ago when lifting up his right arm.  Denies any other specific trauma.  X-ray of  the right elbow shows no obvious fracture dislocation.  He has pain with both range of motion of flexion and extension at the right elbow.  Pain with supination as well.  Tenderness in the right bicipital area.  Suspect a tendinitis or soft tissue injury including a possible bicep tear/ligament tear.  Will recommend NSAIDs and have him follow-up with sports medicine for further care.  Patient given a sling for comfort.  This chart was dictated using voice recognition software.  Despite best efforts to proofread,  errors can occur which can change the documentation meaning.    Final Clinical Impression(s) / ED Diagnoses Final diagnoses:  Right elbow pain    Rx / DC Orders ED Discharge Orders    None       Virgina Norfolk, DO 10/03/20 0623

## 2020-10-14 ENCOUNTER — Encounter: Payer: Self-pay | Admitting: Family Medicine

## 2020-10-14 ENCOUNTER — Ambulatory Visit: Payer: Self-pay

## 2020-10-14 ENCOUNTER — Other Ambulatory Visit: Payer: Self-pay

## 2020-10-14 ENCOUNTER — Ambulatory Visit (INDEPENDENT_AMBULATORY_CARE_PROVIDER_SITE_OTHER): Payer: Self-pay | Admitting: Family Medicine

## 2020-10-14 VITALS — BP 149/80 | HR 83 | Ht 67.0 in | Wt 320.0 lb

## 2020-10-14 DIAGNOSIS — S53101D Unspecified subluxation of right ulnohumeral joint, subsequent encounter: Secondary | ICD-10-CM | POA: Insufficient documentation

## 2020-10-14 DIAGNOSIS — M79601 Pain in right arm: Secondary | ICD-10-CM

## 2020-10-14 DIAGNOSIS — S53101A Unspecified subluxation of right ulnohumeral joint, initial encounter: Secondary | ICD-10-CM | POA: Insufficient documentation

## 2020-10-14 NOTE — Progress Notes (Signed)
Keldon Lassen - 34 y.o. male MRN [id number]  Date of birth: 15-Feb-1986  SUBJECTIVE:  Including CC & ROS.  Chief Complaint  Patient presents with  . Elbow Pain    right elbow pain/injury x 10-01-20    Taksh Hjort is a 34 y.o. male that is presenting with right elbow pain.  The pain is been present for 2 weeks.  Initially felt a pop when he is stretching his elbow.  Now he has been unable to flex or extend the elbow completely.  No history of surgery.  Pain is worse in the morning.  Seems to get better the more he moves the elbow.  Has some weakness in the arm..  Independent review of the right elbow x-ray from 10/16 shows no joint abnormalities with possible bicipital tear.   Review of Systems See HPI   HISTORY: Past Medical, Surgical, Social, and Family History Reviewed & Updated per EMR.   Pertinent Historical Findings include:  Past Medical History:  Diagnosis Date  . Morbid obesity (HCC)   . Obese     Past Surgical History:  Procedure Laterality Date  . TONSILLECTOMY      No family history on file.  Social History   Socioeconomic History  . Marital status: Single    Spouse name: Not on file  . Number of children: Not on file  . Years of education: Not on file  . Highest education level: Not on file  Occupational History  . Not on file  Tobacco Use  . Smoking status: Former Games developer  . Smokeless tobacco: Never Used  Vaping Use  . Vaping Use: Never used  Substance and Sexual Activity  . Alcohol use: No  . Drug use: Yes    Types: Marijuana  . Sexual activity: Not on file  Other Topics Concern  . Not on file  Social History Narrative  . Not on file   Social Determinants of Health   Financial Resource Strain:   . Difficulty of Paying Living Expenses: Not on file  Food Insecurity:   . Worried About Programme researcher, broadcasting/film/video in the Last Year: Not on file  . Ran Out of Food in the Last Year: Not on file  Transportation Needs:   . Lack of Transportation  (Medical): Not on file  . Lack of Transportation (Non-Medical): Not on file  Physical Activity:   . Days of Exercise per Week: Not on file  . Minutes of Exercise per Session: Not on file  Stress:   . Feeling of Stress : Not on file  Social Connections:   . Frequency of Communication with Friends and Family: Not on file  . Frequency of Social Gatherings with Friends and Family: Not on file  . Attends Religious Services: Not on file  . Active Member of Clubs or Organizations: Not on file  . Attends Banker Meetings: Not on file  . Marital Status: Not on file  Intimate Partner Violence:   . Fear of Current or Ex-Partner: Not on file  . Emotionally Abused: Not on file  . Physically Abused: Not on file  . Sexually Abused: Not on file     PHYSICAL EXAM:  VS: BP (!) 149/80   Pulse 83   Ht 5\' 7"  (1.702 m)   BMI 63.75 kg/m  Physical Exam Gen: NAD, alert, cooperative with exam, well-appearing MSK:  Right elbow: Lack of full extension or flexion actively and passively. Normal supination and pronation. Pain with resistance to flexion. No  instability. Biceps tendon palpated with hook test. Neurovascular intact  Limited ultrasound: Right elbow:  Normal appearing origin at the lateral condyle. There appears to be a mild effusion of the posterior elbow joint. The distal biceps tendon is intact in the antecubital fossa as well as at the insertion in short axis. There is increased hyperemia of brachioradialis within the muscle but no structural changes appreciated. .  Summary: Findings suggest an elbow subluxation  Ultrasound and interpretation by Clare Gandy, MD    ASSESSMENT & PLAN:   Elbow subluxation, right, initial encounter Injury occurred on 10/14.  The biceps tendon appears to be intact.  He does have limitations in his range of motion actively and passively.  Initially felt a pop, this would suggest a possible subluxation given the hyperemia throughout  and around the elbow joint itself. -Counseled on home exercise therapy and supportive care. -Counseled on sling. -Follow-up in 2 weeks.  Consider further imaging if still having lack of motion.

## 2020-10-14 NOTE — Assessment & Plan Note (Signed)
Injury occurred on 10/14.  The biceps tendon appears to be intact.  He does have limitations in his range of motion actively and passively.  Initially felt a pop, this would suggest a possible subluxation given the hyperemia throughout and around the elbow joint itself. -Counseled on home exercise therapy and supportive care. -Counseled on sling. -Follow-up in 2 weeks.  Consider further imaging if still having lack of motion.

## 2020-10-14 NOTE — Patient Instructions (Signed)
Nice to meet you Please try the range of motion  Please try ice   Please send me a message in MyChart with any questions or updates.  Please see me back in 2 weeks.   --Dr. Jordan Likes

## 2020-10-28 ENCOUNTER — Other Ambulatory Visit: Payer: Self-pay

## 2020-10-28 ENCOUNTER — Ambulatory Visit (INDEPENDENT_AMBULATORY_CARE_PROVIDER_SITE_OTHER): Payer: Self-pay | Admitting: Family Medicine

## 2020-10-28 ENCOUNTER — Encounter: Payer: Self-pay | Admitting: Family Medicine

## 2020-10-28 DIAGNOSIS — S53101D Unspecified subluxation of right ulnohumeral joint, subsequent encounter: Secondary | ICD-10-CM

## 2020-10-28 NOTE — Assessment & Plan Note (Addendum)
Has shown improvement since the injury.  Still falls in line with likely a subluxation.  No deficits on exam.  He still feels uneasy when reaching out and trying to lift anything. -Counseled on home exercise therapy and supportive care. -Provided work note. -Could consider physical therapy or further imaging if needed.

## 2020-10-28 NOTE — Progress Notes (Signed)
Johnny Brewer - 34 y.o. male MRN [id number]  Date of birth: 1986-04-24  SUBJECTIVE:  Including CC & ROS.  Chief Complaint  Patient presents with  . Follow-up    right elbow    Johnny Brewer is a 34 y.o. male that is following up for his right elbow pain.  He has had significant improvement in his pain and function.  His range of motion is getting back to normal.  He still feels unstable when he is reaching out trying to lift anything.   Review of Systems See HPI   HISTORY: Past Medical, Surgical, Social, and Family History Reviewed & Updated per EMR.   Pertinent Historical Findings include:  Past Medical History:  Diagnosis Date  . Morbid obesity (HCC)   . Obese     Past Surgical History:  Procedure Laterality Date  . TONSILLECTOMY      No family history on file.  Social History   Socioeconomic History  . Marital status: Single    Spouse name: Not on file  . Number of children: Not on file  . Years of education: Not on file  . Highest education level: Not on file  Occupational History  . Not on file  Tobacco Use  . Smoking status: Former Games developer  . Smokeless tobacco: Never Used  Vaping Use  . Vaping Use: Never used  Substance and Sexual Activity  . Alcohol use: No  . Drug use: Yes    Types: Marijuana  . Sexual activity: Not on file  Other Topics Concern  . Not on file  Social History Narrative  . Not on file   Social Determinants of Health   Financial Resource Strain:   . Difficulty of Paying Living Expenses: Not on file  Food Insecurity:   . Worried About Programme researcher, broadcasting/film/video in the Last Year: Not on file  . Ran Out of Food in the Last Year: Not on file  Transportation Needs:   . Lack of Transportation (Medical): Not on file  . Lack of Transportation (Non-Medical): Not on file  Physical Activity:   . Days of Exercise per Week: Not on file  . Minutes of Exercise per Session: Not on file  Stress:   . Feeling of Stress : Not on file  Social  Connections:   . Frequency of Communication with Friends and Family: Not on file  . Frequency of Social Gatherings with Friends and Family: Not on file  . Attends Religious Services: Not on file  . Active Member of Clubs or Organizations: Not on file  . Attends Banker Meetings: Not on file  . Marital Status: Not on file  Intimate Partner Violence:   . Fear of Current or Ex-Partner: Not on file  . Emotionally Abused: Not on file  . Physically Abused: Not on file  . Sexually Abused: Not on file     PHYSICAL EXAM:  VS: BP 121/75   Pulse 83   Ht 5\' 9"  (1.753 m)   BMI 47.26 kg/m  Physical Exam Gen: NAD, alert, cooperative with exam, well-appearing MSK:  Right elbow: Normal range of motion. Normal strength resistance. No instability. Neurovascularly intact     ASSESSMENT & PLAN:   Elbow subluxation, right, subsequent encounter Has shown improvement since the injury.  Still falls in line with likely a subluxation.  No deficits on exam.  He still feels uneasy when reaching out and trying to lift anything. -Counseled on home exercise therapy and supportive care. -Provided work  note. -Could consider physical therapy or further imaging if needed.

## 2020-10-28 NOTE — Patient Instructions (Signed)
Good to see you Please try ice  Please try the exercises  Please send me a message in MyChart with any questions or updates.  Please see me back in 4 weeks.   --Dr. Vonnie Spagnolo  

## 2020-12-02 ENCOUNTER — Ambulatory Visit: Payer: Self-pay | Admitting: Family Medicine

## 2020-12-16 ENCOUNTER — Ambulatory Visit: Payer: Self-pay | Admitting: Family Medicine

## 2020-12-16 NOTE — Progress Notes (Deleted)
  Johnny Brewer - 34 y.o. male MRN [id number]  Date of birth: [birth year]  SUBJECTIVE:  Including CC & ROS.  No chief complaint on file.   Johnny Brewer is a 34 y.o. male that is  ***.  ***   Review of Systems See HPI   HISTORY: Past Medical, Surgical, Social, and Family History Reviewed & Updated per EMR.   Pertinent Historical Findings include:  Past Medical History:  Diagnosis Date  . Morbid obesity (HCC)   . Obese     Past Surgical History:  Procedure Laterality Date  . TONSILLECTOMY      No family history on file.  Social History   Socioeconomic History  . Marital status: Single    Spouse name: Not on file  . Number of children: Not on file  . Years of education: Not on file  . Highest education level: Not on file  Occupational History  . Not on file  Tobacco Use  . Smoking status: Former Games developer  . Smokeless tobacco: Never Used  Vaping Use  . Vaping Use: Never used  Substance and Sexual Activity  . Alcohol use: No  . Drug use: Yes    Types: Marijuana  . Sexual activity: Not on file  Other Topics Concern  . Not on file  Social History Narrative  . Not on file   Social Determinants of Health   Financial Resource Strain: Not on file  Food Insecurity: Not on file  Transportation Needs: Not on file  Physical Activity: Not on file  Stress: Not on file  Social Connections: Not on file  Intimate Partner Violence: Not on file     PHYSICAL EXAM:  VS: There were no vitals taken for this visit. Physical Exam Gen: NAD, alert, cooperative with exam, well-appearing MSK:  ***      ASSESSMENT & PLAN:   No problem-specific Assessment & Plan notes found for this encounter.

## 2020-12-22 ENCOUNTER — Ambulatory Visit: Payer: Self-pay | Admitting: Family Medicine

## 2020-12-22 NOTE — Progress Notes (Deleted)
  Johnny Brewer - 35 y.o. male MRN [id number]  Date of birth: 24-Nov-1986  SUBJECTIVE:  Including CC & ROS.  No chief complaint on file.   Johnny Brewer is a 35 y.o. male that is  ***.  ***   Review of Systems See HPI   HISTORY: Past Medical, Surgical, Social, and Family History Reviewed & Updated per EMR.   Pertinent Historical Findings include:  Past Medical History:  Diagnosis Date  . Morbid obesity (HCC)   . Obese     Past Surgical History:  Procedure Laterality Date  . TONSILLECTOMY      No family history on file.  Social History   Socioeconomic History  . Marital status: Single    Spouse name: Not on file  . Number of children: Not on file  . Years of education: Not on file  . Highest education level: Not on file  Occupational History  . Not on file  Tobacco Use  . Smoking status: Former Games developer  . Smokeless tobacco: Never Used  Vaping Use  . Vaping Use: Never used  Substance and Sexual Activity  . Alcohol use: No  . Drug use: Yes    Types: Marijuana  . Sexual activity: Not on file  Other Topics Concern  . Not on file  Social History Narrative  . Not on file   Social Determinants of Health   Financial Resource Strain: Not on file  Food Insecurity: Not on file  Transportation Needs: Not on file  Physical Activity: Not on file  Stress: Not on file  Social Connections: Not on file  Intimate Partner Violence: Not on file     PHYSICAL EXAM:  VS: There were no vitals taken for this visit. Physical Exam Gen: NAD, alert, cooperative with exam, well-appearing MSK:  ***      ASSESSMENT & PLAN:   No problem-specific Assessment & Plan notes found for this encounter.

## 2021-09-13 IMAGING — CR DG ELBOW COMPLETE 3+V*R*
4 series · 4 of 4 positions shown · non-contrast
Comparison: None.

CLINICAL DATA: Stretching injury affecting the right elbow now with
inability to fully extend the elbow with pain primarily medially
located.

EXAM:
RIGHT ELBOW - COMPLETE 3+ VIEW

[x elbow joint ap right]
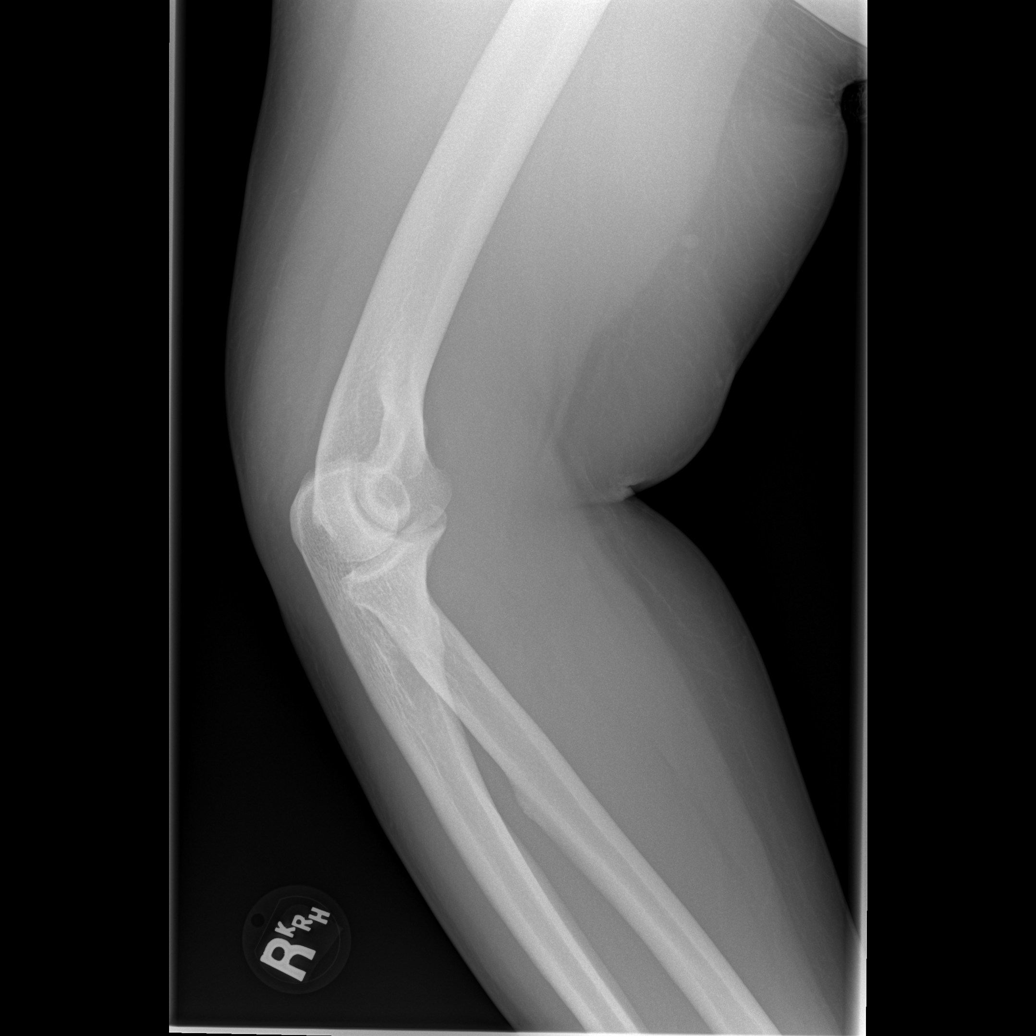

[x elbow joint lat right]
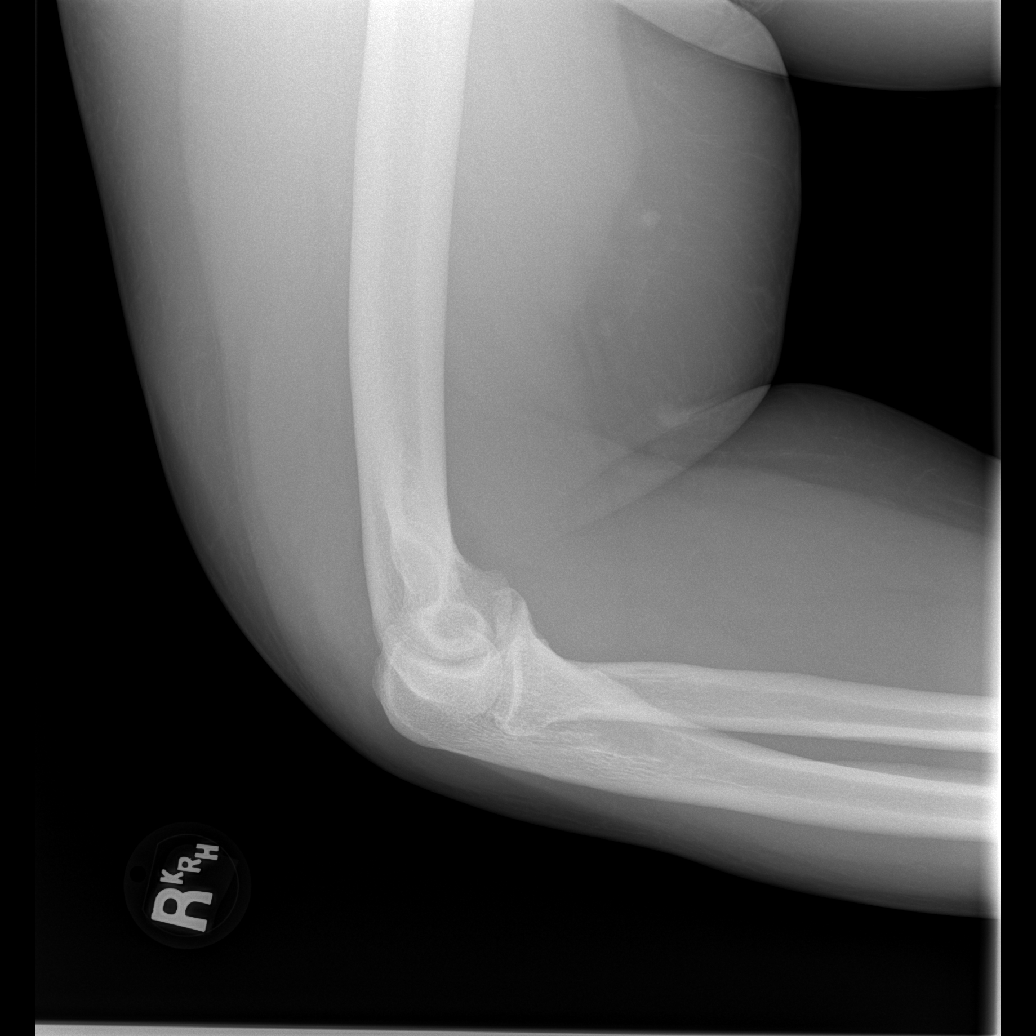

[x elbow joint obl. right (1 of 2)]
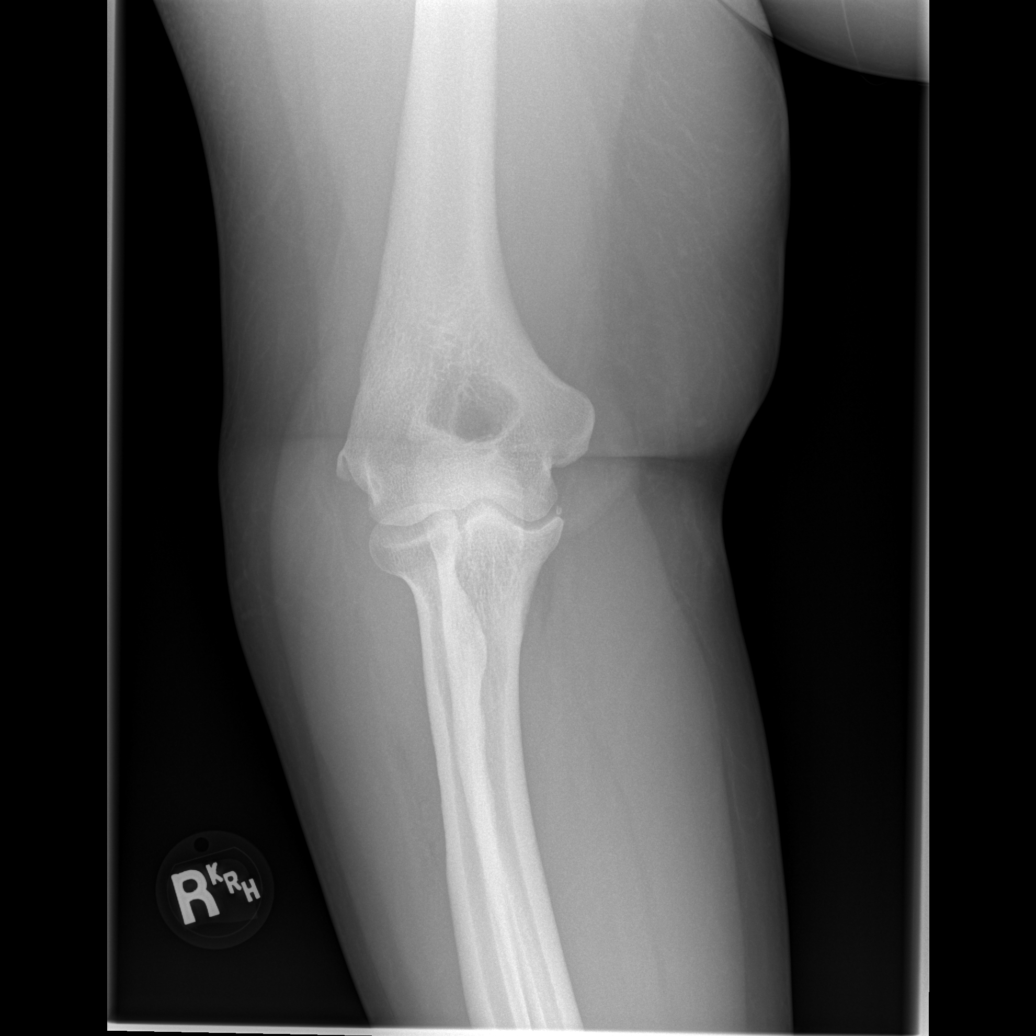

[x elbow joint obl. right (2 of 2)]
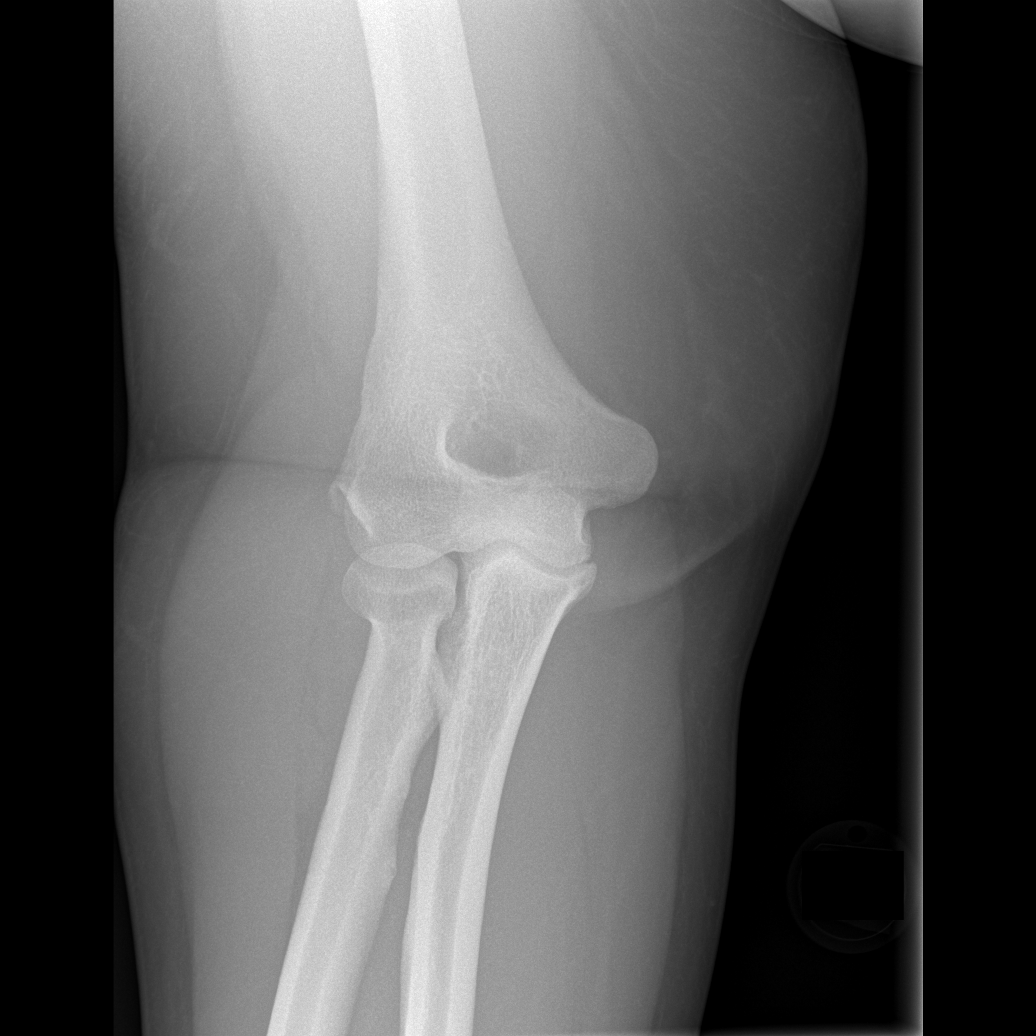

[4 of 4 positions shown; findings below may reference images not displayed]

FINDINGS: Examination is degraded due to patient body habitus.

No definite fracture or elbow joint effusion. There is a
peripherally calcified punctate ossicle adjacent to the medial
aspect of the proximal ulna as well as enthesopathic change
involving the medial epicondyle, both of which likely represent the
sequela of remote avulsive injury. Joint spaces appear preserved.

There is potential retraction of the biceps musculature as could be
seen in the setting of age-indeterminate biceps injury/tear.
Regional soft tissues appear otherwise normal.
IMPRESSION: 1. No definite acute fracture or elbow joint effusion.
2. Potential retraction of the biceps musculature as could be seen
in the setting of age-indeterminate biceps injury/tear. Further
evaluation could be performed with MRI as indicated.

## 2023-04-05 ENCOUNTER — Encounter: Payer: Self-pay | Admitting: *Deleted

## 2024-11-28 ENCOUNTER — Other Ambulatory Visit: Payer: Self-pay

## 2024-11-28 ENCOUNTER — Other Ambulatory Visit (HOSPITAL_BASED_OUTPATIENT_CLINIC_OR_DEPARTMENT_OTHER): Payer: Self-pay

## 2024-11-28 ENCOUNTER — Emergency Department (HOSPITAL_BASED_OUTPATIENT_CLINIC_OR_DEPARTMENT_OTHER)
Admission: EM | Admit: 2024-11-28 | Discharge: 2024-11-28 | Disposition: A | Discharge status: Home / Self Care | Payer: Self-pay | Attending: Emergency Medicine | Admitting: Emergency Medicine

## 2024-11-28 ENCOUNTER — Emergency Department (HOSPITAL_BASED_OUTPATIENT_CLINIC_OR_DEPARTMENT_OTHER): Payer: Self-pay

## 2024-11-28 ENCOUNTER — Encounter (HOSPITAL_BASED_OUTPATIENT_CLINIC_OR_DEPARTMENT_OTHER): Payer: Self-pay | Admitting: Emergency Medicine

## 2024-11-28 DIAGNOSIS — F17200 Nicotine dependence, unspecified, uncomplicated: Secondary | ICD-10-CM | POA: Insufficient documentation

## 2024-11-28 DIAGNOSIS — U071 COVID-19: Secondary | ICD-10-CM | POA: Insufficient documentation

## 2024-11-28 LAB — RESP PANEL BY RT-PCR (RSV, FLU A&B, COVID)  RVPGX2
Influenza A by PCR: NEGATIVE
Influenza B by PCR: NEGATIVE
Resp Syncytial Virus by PCR: NEGATIVE
SARS Coronavirus 2 by RT PCR: POSITIVE — AB

## 2024-11-28 MED ORDER — BENZONATATE 100 MG PO CAPS
100.0000 mg | ORAL_CAPSULE | Freq: Three times a day (TID) | ORAL | 0 refills | Status: AC
  Filled 2024-11-28: qty 15, 5d supply, fill #0

## 2024-11-28 NOTE — ED Provider Notes (Signed)
 Johnny Brewer Provider Note   CSN: [id number] Arrival date & time: 11/28/24  1614     Patient presents with: URI   Johnny Brewer is a 38 y.o. male.   Patient presents to the emergency department for evaluation of congestion, cough, myalgias.  Symptoms started about 5 days ago but then patient felt a lot worse yesterday.  Increased fatigue.  No shortness of breath.  He has had some sore throat.  Reports a coworker had pneumonia.  Subjective fever at home.  No history of asthma.  Patient does use tobacco.       Prior to Admission medications  Medication Sig Start Date End Date Taking? Authorizing Provider  benzonatate  (TESSALON ) 100 MG capsule Take 1 capsule (100 mg total) by mouth every 8 (eight) hours. 11/26/18   Tegeler, Lonni PARAS, MD  doxycycline  (VIBRAMYCIN ) 100 MG capsule Take 1 capsule (100 mg total) by mouth 2 (two) times daily. 09/13/13   Charlyn Sora, MD  ibuprofen  (ADVIL ,MOTRIN ) 800 MG tablet Take 1 tablet (800 mg total) by mouth 3 (three) times daily. 09/01/13   Elpidio Lamp, MD  ondansetron  (ZOFRAN ) 4 MG tablet Take 1 tablet (4 mg total) by mouth every 8 (eight) hours as needed for nausea or vomiting. 11/26/18   Tegeler, Lonni PARAS, MD  predniSONE  (DELTASONE ) 50 MG tablet Take 1 tablet (50 mg total) by mouth daily. 09/13/13   Charlyn Sora, MD  Pseudoeph-Doxylamine-DM-APAP (NIGHT TIME LIQUID CAPS PO) Take by mouth.    [provider]    Allergies: Patient has no known allergies.    Review of Systems  Updated Vital Signs BP (!) 163/91 (BP Location: Right Arm)   Pulse 99   Temp 98.6 F (37 C) (Oral)   Resp 20   SpO2 100%   Physical Exam Vitals and nursing note reviewed.  Constitutional:      Appearance: He is well-developed.  HENT:     Head: Normocephalic and atraumatic.     Jaw: No trismus.     Right Ear: Tympanic membrane, ear canal and external ear normal.     Left Ear: Tympanic membrane, ear  canal and external ear normal.     Nose: Congestion and rhinorrhea present. No mucosal edema.     Mouth/Throat:     Mouth: Mucous membranes are not dry.     Pharynx: Uvula midline. No oropharyngeal exudate, posterior oropharyngeal erythema or uvula swelling.     Tonsils: No tonsillar abscesses.  Eyes:     General:        Right eye: No discharge.        Left eye: No discharge.     Conjunctiva/sclera: Conjunctivae normal.  Cardiovascular:     Rate and Rhythm: Normal rate and regular rhythm.     Heart sounds: Normal heart sounds.  Pulmonary:     Effort: Pulmonary effort is normal. No respiratory distress.     Breath sounds: Normal breath sounds. No wheezing or rales.  Abdominal:     Palpations: Abdomen is soft.     Tenderness: There is no abdominal tenderness.  Musculoskeletal:     Cervical back: Normal range of motion and neck supple.  Skin:    General: Skin is warm and dry.  Neurological:     Mental Status: He is alert.     (all labs ordered are listed, but only abnormal results are displayed) Labs Reviewed  RESP PANEL BY RT-PCR (RSV, FLU A&B, COVID)  RVPGX2 - Abnormal;  Notable for the following components:      Result Value   SARS Coronavirus 2 by RT PCR POSITIVE (*)    All other components within normal limits    EKG: None  Radiology: DG Chest 2 View Result Date: 11/28/2024 CLINICAL DATA:  Cough EXAM: DG CHEST 2V COMPARISON:  11/26/2018 FINDINGS: Mild central airways thickening. No consolidation or effusion. Normal cardiac size. No pneumothorax IMPRESSION: Mild central airways thickening, possible bronchitis. No focal airspace disease Electronically Signed   By: Luke Bun M.D.   On: 11/28/2024 17:02     Procedures   Medications Ordered in the ED - No data to display  ED Course  Patient seen and examined. History obtained directly from patient.   Labs/EKG: Ordered viral panel Imaging: Ordered chest x-ray.  Medications/Fluids: Ordered  Most recent vital  signs reviewed and are as follows: BP (!) 163/91 (BP Location: Right Arm)   Pulse 99   Temp 98.6 F (37 C) (Oral)   Resp 20   SpO2 100%   Initial impression: Upper respiratory infection, high blood pressure reading  5:25 PM Reassessment performed. Patient appears stable, comfortable.  Labs personally reviewed and interpreted including: Viral panel positive for COVID, chest x-ray agree suggestive of bronchitis.  Reviewed pertinent lab work and imaging with patient at bedside. Questions answered.   Most current vital signs reviewed and are as follows: BP (!) 163/91 (BP Location: Right Arm)   Pulse 99   Temp 98.6 F (37 C) (Oral)   Resp 20   SpO2 100%   Plan: Discharge to home.   Prescriptions written for: Tessalon   Other home care instructions discussed: Discussed importance of rest, maintaining good hydration. Also discussed use of OTC meds as desired for symptomatic treatment. Discussed typical course of COVID.  ED return instructions discussed: Encouraged return with severe symptoms or significant worsening of current symptoms.  This includes high persistent fever, persistent vomiting, worsening difficulty breathing or shortness of breath, increased work of breathing, or other concerns.  Follow-up instructions discussed: Patient encouraged to follow-up with their PCP in 5 days if not improving.                                   Medical Decision Making Amount and/or Complexity of Data Reviewed Radiology: ordered.  Risk Prescription drug management.   Patient with respiratory symptoms and flulike illness, tested positive for COVID-19.  Patient looks well, nontoxic.  No respiratory distress.  Chest x-ray without signs of infiltrate, does suggest bronchitis which is consistent with the patient's illness.  Will continue symptomatic treatment.  Discussed return precautions as above.     Final diagnoses:  COVID    ED Discharge Orders          Ordered    benzonatate   (TESSALON ) 100 MG capsule  Every 8 hours        11/28/24 1723               Desiderio Chew, PA-C 11/28/24 1726    Long, Addysin Porco G, MD 11/28/24 1801

## 2024-11-28 NOTE — ED Triage Notes (Signed)
 Pt with URI symptoms for several days. Sinus congestion, fever, chills, body aches.  Pt reports he is worried  that he has pneumonia.

## 2024-11-28 NOTE — Discharge Instructions (Signed)
 Please read and follow all provided instructions.  Your diagnoses today include:  1. COVID     Tests performed today include: Vital signs. See below for your results today.  COVID test - positive Chest x-ray: Shows signs of bronchitis but no pneumonia  Medications prescribed:  Tessalon  Perles - cough suppressant medication  Take any prescribed medications only as directed. Treatment for your infection is aimed at treating the symptoms. There are no medications, such as antibiotics, that will cure your infection.   Home care instructions:  Follow any educational materials contained in this packet.   Your illness is contagious and can be spread to others, especially during the first 3 or 4 days. It cannot be cured by antibiotics or other medicines. Take basic precautions such as washing your hands often, covering your mouth when you cough or sneeze, and avoiding public places where you could spread your illness to others.   Please continue drinking plenty of fluids.  Use over-the-counter medicines as needed as directed on packaging for symptom relief.  You may also use ibuprofen  or tylenol  as directed on packaging for pain or fever.  Do not take multiple medicines containing Tylenol  or acetaminophen  to avoid taking too much of this medication.  If you are positive for Covid-19, you should isolate yourself and not be exposed to other people for 5 days after your symptoms began. If you are not feeling better at day 5, you need to isolate yourself for a total of 10 days. If you are feeling better by day 5, you should wear a mask properly, over your nose and mouth, at all times while around other people until 10 days after your symptoms started.   Follow-up instructions: Please follow-up with your primary care provider as needed for further evaluation of your symptoms if you are not feeling better.   Return instructions:  Please return to the Emergency Department if you experience worsening  symptoms.  Return to the emergency department if you have worsening shortness of breath breathing or increased work of breathing, persistent vomiting RETURN IMMEDIATELY IF you develop shortness of breath, confusion or altered mental status, a new rash, become dizzy, faint, or poorly responsive, or are unable to be cared for at home. Please return if you have persistent vomiting and cannot keep down fluids or develop a fever that is not controlled by tylenol  or motrin .   Please return if you have any other emergent concerns.  Additional Information:  Your vital signs today were: BP (!) 163/91 (BP Location: Right Arm)   Pulse 99   Temp 98.6 F (37 C) (Oral)   Resp 20   SpO2 100%  If your blood pressure (BP) was elevated above 135/85 this visit, please have this repeated by your doctor within one month. --------------
